# Patient Record
Sex: Female | Born: 1995 | Race: White | Hispanic: No
Health system: Southern US, Community
[De-identification: ages and names within clinical notes are randomized; demographics above are authoritative.]

---

## 2019-02-27 ENCOUNTER — Other Ambulatory Visit: Payer: Self-pay

## 2019-02-27 DIAGNOSIS — Z20822 Contact with and (suspected) exposure to covid-19: Secondary | ICD-10-CM

## 2019-02-28 LAB — NOVEL CORONAVIRUS, NAA: SARS-CoV-2, NAA: NOT DETECTED

## 2019-03-06 ENCOUNTER — Telehealth: Payer: Self-pay | Admitting: General Practice

## 2019-03-06 NOTE — Telephone Encounter (Signed)
Negative COVID results given. Patient results "NOT Detected." Caller expressed understanding. ° °

## 2019-05-25 ENCOUNTER — Ambulatory Visit: Payer: 59 | Attending: Internal Medicine

## 2019-05-25 DIAGNOSIS — U071 COVID-19: Secondary | ICD-10-CM

## 2019-05-26 LAB — NOVEL CORONAVIRUS, NAA: SARS-CoV-2, NAA: NOT DETECTED

## 2019-06-07 ENCOUNTER — Emergency Department (HOSPITAL_COMMUNITY): Payer: 59

## 2019-06-07 ENCOUNTER — Other Ambulatory Visit: Payer: Self-pay

## 2019-06-07 ENCOUNTER — Emergency Department (HOSPITAL_COMMUNITY)
Admission: EM | Admit: 2019-06-07 | Discharge: 2019-06-07 | Disposition: A | Discharge status: Home / Self Care | Payer: 59 | Attending: Emergency Medicine | Admitting: Emergency Medicine

## 2019-06-07 ENCOUNTER — Encounter (HOSPITAL_COMMUNITY): Payer: Self-pay

## 2019-06-07 DIAGNOSIS — S92515A Nondisplaced fracture of proximal phalanx of left lesser toe(s), initial encounter for closed fracture: Secondary | ICD-10-CM | POA: Insufficient documentation

## 2019-06-07 DIAGNOSIS — Y939 Activity, unspecified: Secondary | ICD-10-CM | POA: Insufficient documentation

## 2019-06-07 DIAGNOSIS — S99922A Unspecified injury of left foot, initial encounter: Secondary | ICD-10-CM | POA: Diagnosis present

## 2019-06-07 DIAGNOSIS — W2203XA Walked into furniture, initial encounter: Secondary | ICD-10-CM | POA: Diagnosis not present

## 2019-06-07 DIAGNOSIS — Y92009 Unspecified place in unspecified non-institutional (private) residence as the place of occurrence of the external cause: Secondary | ICD-10-CM | POA: Diagnosis not present

## 2019-06-07 DIAGNOSIS — Y999 Unspecified external cause status: Secondary | ICD-10-CM | POA: Insufficient documentation

## 2019-06-07 NOTE — ED Provider Notes (Signed)
[facility]-EMERGENCY DEPT Provider Note   CSN: [id number] Arrival date & time: 06/07/19  2220     History Chief Complaint  Patient presents with  . Toe Pain    Erin Blake is a 23 y.o. adult.  The history is provided by the patient and medical records.  Toe Pain    23 y.o. F here with left 2nd toe pain.  States she was sitting on the floor playing a board game with friends when her foot went to sleep.  She got up to go to the kitchen and stubbed her toe on the leg of the couch but did not feel it right away.  Has had worsening pain since that time.  Denies numbness/tingling.  She remains ambulatory.  No intervention tried PTA.  History reviewed. No pertinent past medical history.  There are no problems to display for this patient.     OB History   No obstetric history on file.     History reviewed. No pertinent family history.  Social History   Tobacco Use  . Smoking status: Not on file  Substance Use Topics  . Alcohol use: Not on file  . Drug use: Not on file    Home Medications Prior to Admission medications   Not on File    Allergies    Patient has no known allergies.  Review of Systems   Review of Systems  Musculoskeletal: Positive for arthralgias.  All other systems reviewed and are negative.   Physical Exam Updated Vital Signs BP (!) 149/93 (BP Location: Right Arm)   Pulse (!) 101   Temp 98.7 F (37.1 C) (Oral)   Resp 16   LMP 05/28/2019   SpO2 99%   Physical Exam Vitals and nursing note reviewed.  Constitutional:      Appearance: Erin Blake is well-developed.  HENT:     Head: Normocephalic and atraumatic.  Eyes:     Conjunctiva/sclera: Conjunctivae normal.     Pupils: Pupils are equal, round, and reactive to light.  Cardiovascular:     Rate and Rhythm: Normal rate and regular rhythm.     Heart sounds: Normal heart sounds.  Pulmonary:     Effort: Pulmonary effort is normal.     Breath sounds:  Normal breath sounds.  Abdominal:     General: Bowel sounds are normal.     Palpations: Abdomen is soft.  Musculoskeletal:        General: Normal range of motion.     Cervical back: Normal range of motion.  Feet:     Comments: Left 2nd toe tender along proximal phalanx, good distal sensation and cap refill, able to wiggles toes normally, remainder of foot atraumatic Skin:    General: Skin is warm and dry.  Neurological:     Mental Status: Erin Blake is alert and oriented to person, place, and time.     ED Results / Procedures / Treatments   Labs (all labs ordered are listed, but only abnormal results are displayed) Labs Reviewed - No data to display  EKG None  Radiology DG Toe 2nd Left  Result Date: 06/07/2019 CLINICAL DATA:  Struck second toe on furniture while running in her home. Pain and deformity. EXAM: LEFT SECOND TOE COMPARISON:  None. FINDINGS: Mildly displaced and angulated oblique fracture of the second toe proximal phalanx. No intra-articular extension. No other fracture of the included digits. IMPRESSION: Mildly displaced and angulated second toe proximal phalanx fracture. No intra-articular extension. Electronically  Signed   By: Keith Rake M.D.   On: 06/07/2019 23:11    Procedures .Ortho Injury Treatment  Date/Time: 06/07/2019 11:35 PM Performed by: Larene Pickett, PA-C Authorized by: Larene Pickett, PA-C   Consent:    Consent obtained:  Verbal   Consent given by:  Patient   Risks discussed:  Fracture, nerve damage and stiffness   Alternatives discussed:  No treatmentInjury location: toe Location details: left second toe Injury type: fracture Pre-procedure neurovascular assessment: neurovascularly intact Manipulation performed: no Immobilization: tape Post-procedure neurovascular assessment: post-procedure neurovascularly intact Patient tolerance: patient tolerated the procedure well with no immediate complications Comments: Left 2nd and  3rd toes were buddy taped.  Tolerated well.    (including critical care time)  Medications Ordered in ED Medications - No data to display  ED Course  I have reviewed the triage vital signs and the nursing notes.  Pertinent labs & imaging results that were available during my care of the patient were reviewed by me and considered in my medical decision making (see chart for details).    MDM Rules/Calculators/A&P  23 y.o. F here with left 2nd toe injury after running into the couch leg PTA.  Has mild swelling and tenderness along proximal phalanx of affected toe.  X-ray with mildly displaced fracture, no dislocation or intra-articular extension.  Toes were buddy taped, can continue this at home.  Tylenol or motrin for pain.  Given orthopedic follow-up if needed.  Return here for any new/acute changes.  Final Clinical Impression(s) / ED Diagnoses Final diagnoses:  Closed nondisplaced fracture of proximal phalanx of lesser toe of left foot, initial encounter    Rx / DC Orders ED Discharge Orders    None       Larene Pickett, PA-C 06/07/19 2347    Fatima Blank, MD 06/08/19 [phone]

## 2019-06-07 NOTE — Discharge Instructions (Addendum)
Can continue buddy taping your toes as I have done here. Can take tylenol or motrin for pain. Can follow-up with orthopedics if needed for any issues-- call for appt. Return here for any new/acute changes.

## 2019-06-07 NOTE — ED Triage Notes (Signed)
Pt reports that she stubbed her L 2nd toe. Complaining of pain and swelling.

## 2019-08-30 ENCOUNTER — Ambulatory Visit: Payer: 59 | Attending: Internal Medicine

## 2019-08-30 DIAGNOSIS — Z23 Encounter for immunization: Secondary | ICD-10-CM

## 2019-08-30 NOTE — Progress Notes (Signed)
   Covid-19 Vaccination Clinic  Name:  Subrena Devereux    MRN: [id number] DOB: 07-14-1995  08/30/2019    [place]son was observed post Covid-19 immunization for 15 minutes without incident. Bernette Mayers was provided with Vaccine Information Sheet and instruction to access the V-Safe system.     Bassinger was instructed to call 911 with any severe reactions post vaccine: Marland Kitchen Difficulty breathing  . Swelling of face and throat  . A fast heartbeat  . A bad rash all over body  . Dizziness and weakness   Immunizations Administered    Name Date Dose VIS Date Route   Pfizer COVID-19 Vaccine 08/30/2019  1:27 PM 0.3 mL 05/18/2019 Intramuscular   Manufacturer: [employer], [employer]   Lot: [id number]   NDC: [address]-2

## 2019-09-24 ENCOUNTER — Ambulatory Visit: Payer: 59 | Attending: Internal Medicine

## 2019-09-24 DIAGNOSIS — Z23 Encounter for immunization: Secondary | ICD-10-CM

## 2019-09-24 NOTE — Progress Notes (Signed)
   Covid-19 Vaccination Clinic  Name:  Erin Blake    MRN: [id number] DOB: November 17, 1995  09/24/2019    Erin Blake was observed post Covid-19 immunization for 15 minutes without incident. Erin Blake was provided with Vaccine Information Sheet and instruction to access the V-Safe system.     Erin Blake was instructed to call 911 with any severe reactions post vaccine: Marland Kitchen Difficulty breathing  . Swelling of face and throat  . A fast heartbeat  . A bad rash all over body  . Dizziness and weakness   Immunizations Administered    Name Date Dose VIS Date Route   Pfizer COVID-19 Vaccine 09/24/2019 12:37 PM 0.3 mL 08/01/2018 Intramuscular   Manufacturer: [employer], [employer]   Lot: [id number]   NDC: [address]-9

## 2020-08-27 IMAGING — CR DG TOE 2ND 2+V*L*
5 series · 5 of 5 positions shown · non-contrast
Comparison: None.

CLINICAL DATA: Struck second toe on furniture while running in her
home. Pain and deformity.

EXAM:
LEFT SECOND TOE

[x toes ap left]
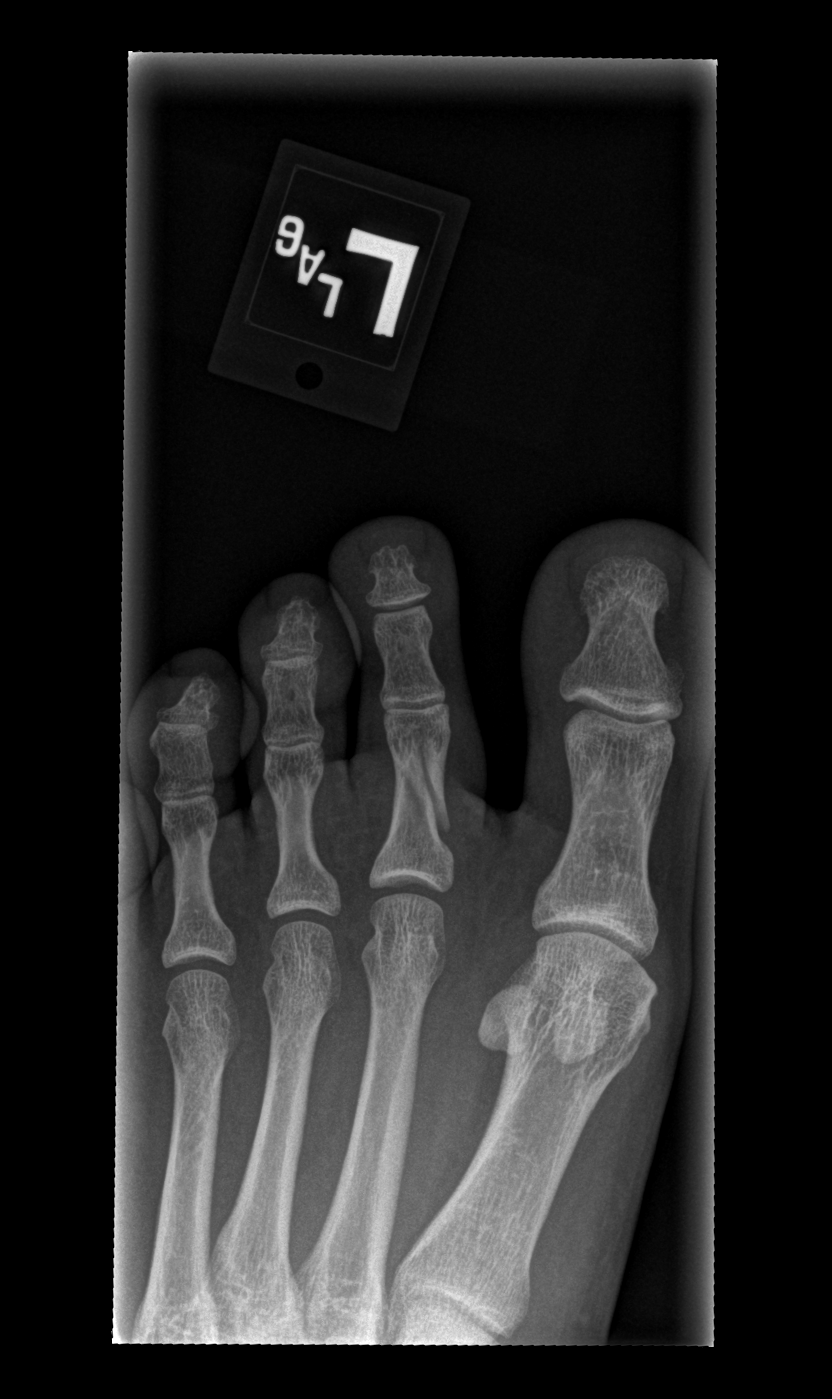

[x toes obl left]
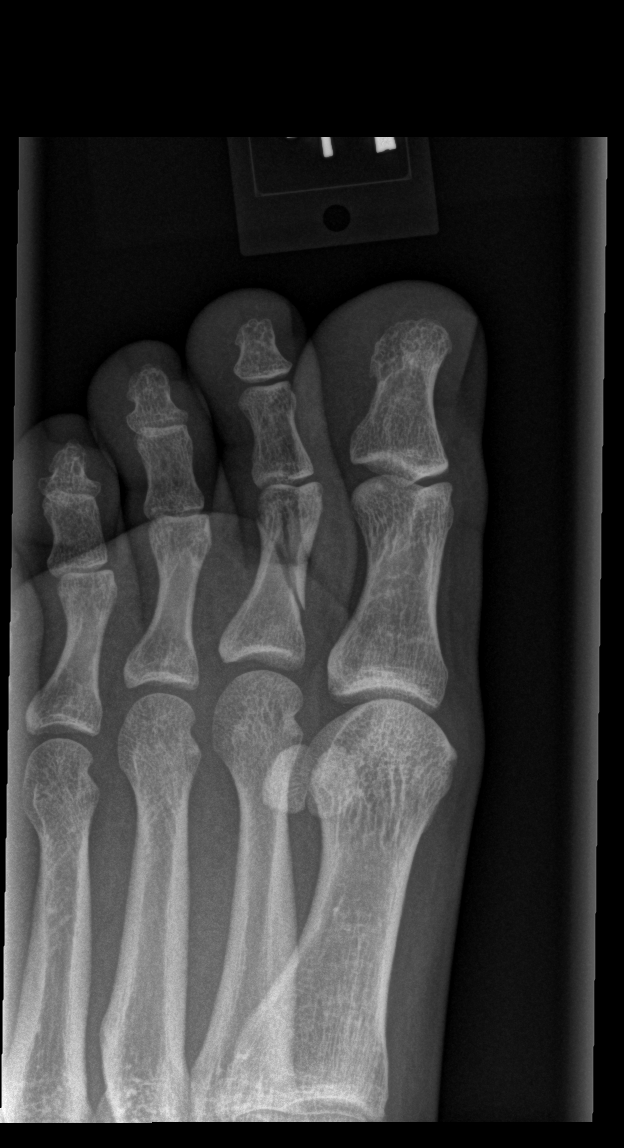

[x toes lat left (1 of 3)]
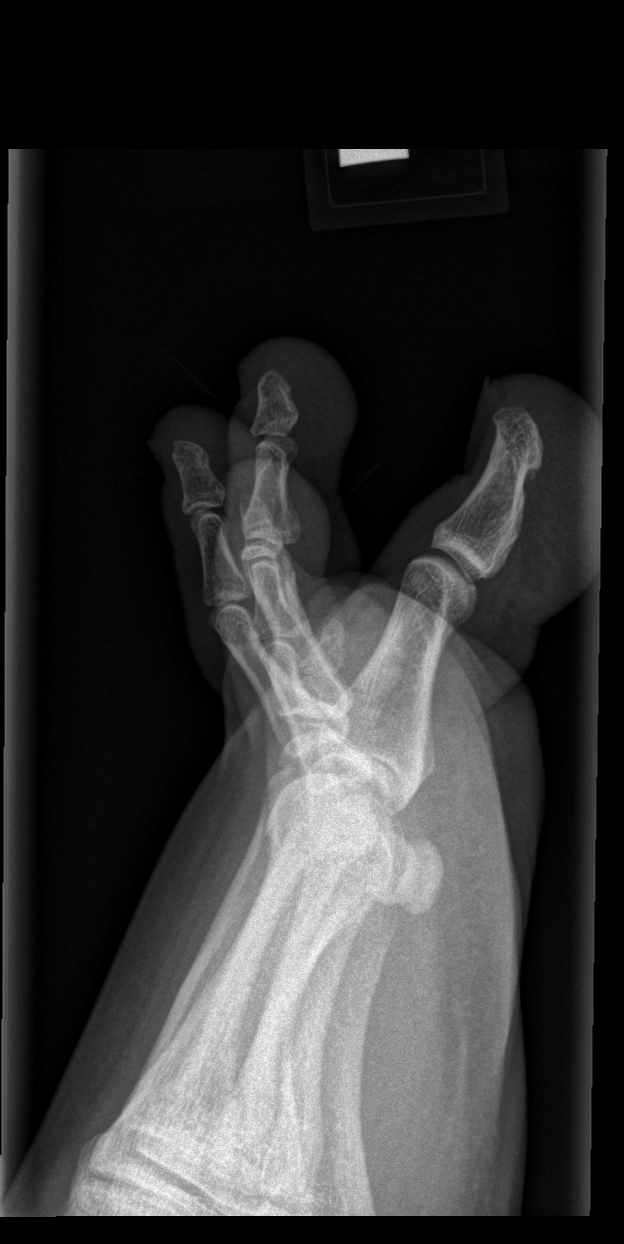

[x toes lat left (2 of 3)]
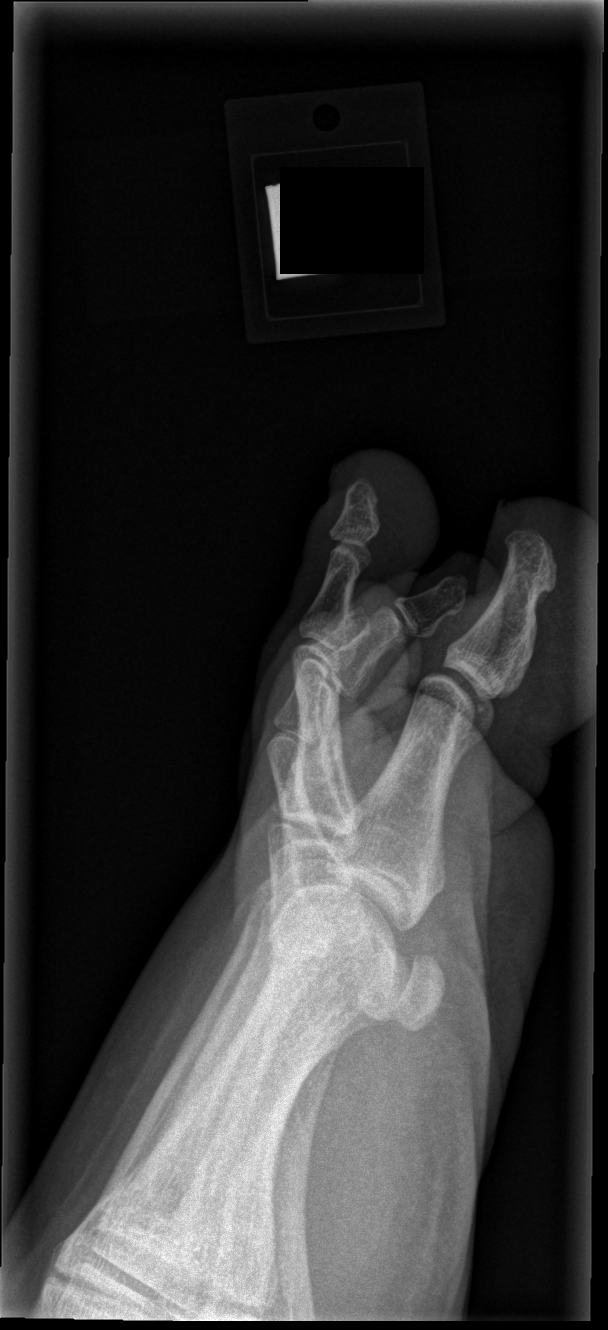

[x toes lat left (3 of 3)]
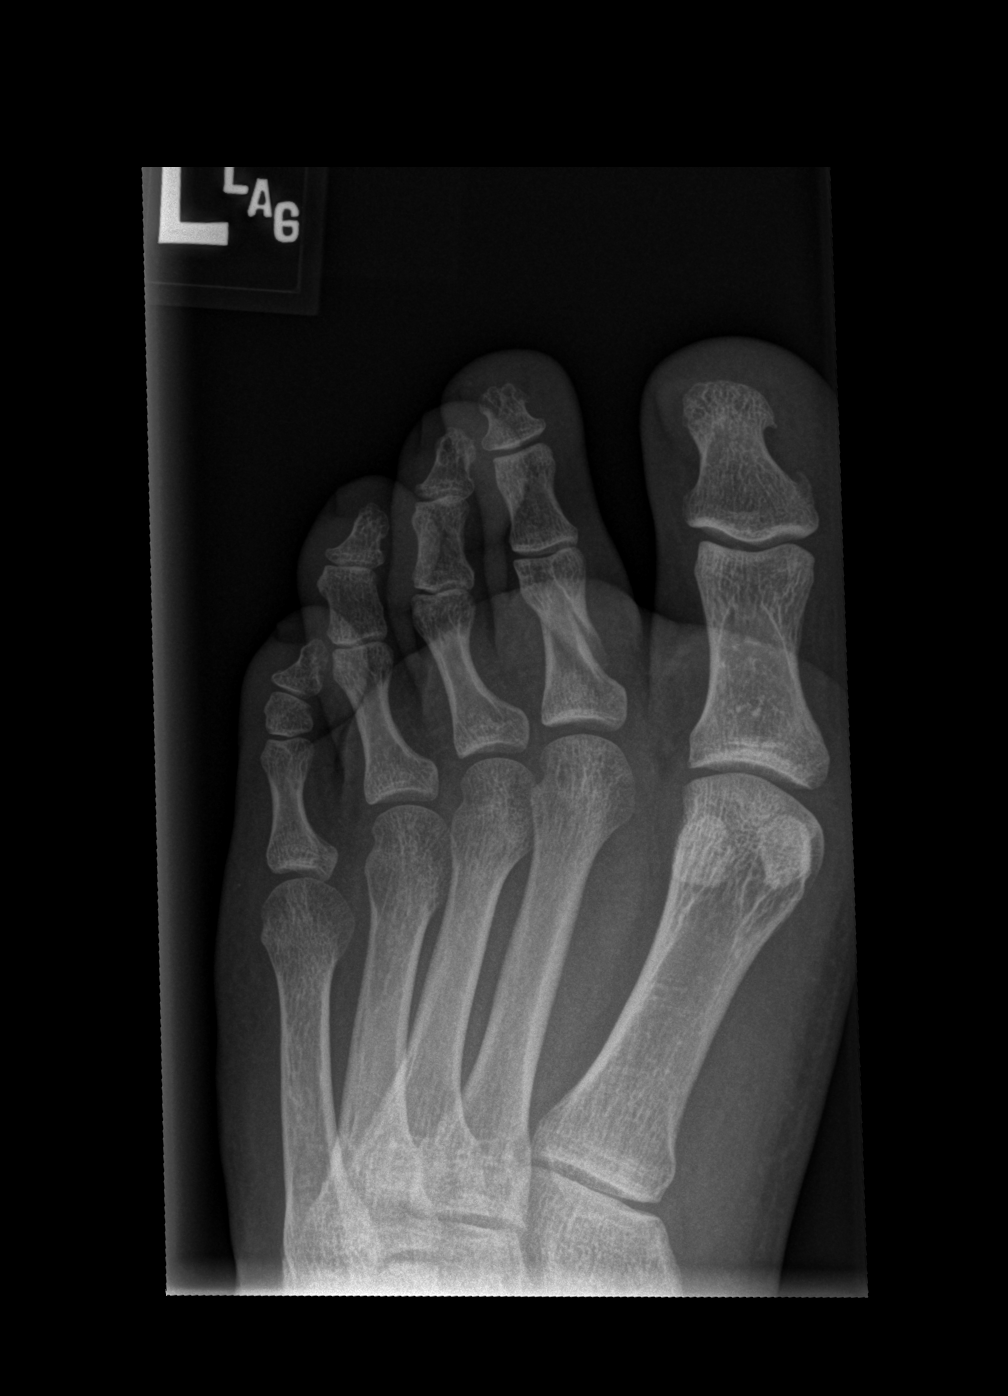

[5 of 5 positions shown; findings below may reference images not displayed]

FINDINGS: Mildly displaced and angulated oblique fracture of the second toe
proximal phalanx. No intra-articular extension. No other fracture of
the included digits.
IMPRESSION: Mildly displaced and angulated second toe proximal phalanx fracture.
No intra-articular extension.
